# Patient Record
Sex: Male | Born: 1950 | Race: Black or African American | Hispanic: No | Marital: Single | Smoking: Never smoker
Health system: Southern US, Community
[De-identification: ages and names within clinical notes are randomized; demographics above are authoritative.]

---

## 2012-11-16 ENCOUNTER — Emergency Department (HOSPITAL_COMMUNITY): Payer: Self-pay

## 2012-11-16 ENCOUNTER — Encounter (HOSPITAL_COMMUNITY): Payer: Self-pay

## 2012-11-16 ENCOUNTER — Emergency Department (HOSPITAL_COMMUNITY)
Admission: EM | Admit: 2012-11-16 | Discharge: 2012-11-16 | Disposition: A | Discharge status: Home / Self Care | Payer: Self-pay | Attending: Emergency Medicine | Admitting: Emergency Medicine

## 2012-11-16 DIAGNOSIS — R059 Cough, unspecified: Secondary | ICD-10-CM | POA: Insufficient documentation

## 2012-11-16 DIAGNOSIS — R05 Cough: Secondary | ICD-10-CM | POA: Insufficient documentation

## 2012-11-16 DIAGNOSIS — R509 Fever, unspecified: Secondary | ICD-10-CM | POA: Insufficient documentation

## 2012-11-16 DIAGNOSIS — R51 Headache: Secondary | ICD-10-CM | POA: Insufficient documentation

## 2012-11-16 LAB — COMPREHENSIVE METABOLIC PANEL
Albumin: 3.6 g/dL (ref 3.5–5.2)
Alkaline Phosphatase: 93 U/L (ref 39–117)
BUN: 12 mg/dL (ref 6–23)
Chloride: 94 mEq/L — ABNORMAL LOW (ref 96–112)
Creatinine, Ser: 1.24 mg/dL (ref 0.50–1.35)
GFR calc Af Amer: 72 mL/min — ABNORMAL LOW (ref >90)
GFR calc non Af Amer: 62 mL/min — ABNORMAL LOW (ref >90)
Glucose, Bld: 110 mg/dL — ABNORMAL HIGH (ref 70–99)
Total Bilirubin: 0.5 mg/dL (ref 0.3–1.2)

## 2012-11-16 LAB — URINALYSIS, ROUTINE W REFLEX MICROSCOPIC
Glucose, UA: NEGATIVE mg/dL
Ketones, ur: 15 mg/dL — AB
Leukocytes, UA: NEGATIVE
Nitrite: NEGATIVE
Protein, ur: NEGATIVE mg/dL
Urobilinogen, UA: 1 mg/dL (ref 0.0–1.0)

## 2012-11-16 LAB — CBC WITH DIFFERENTIAL/PLATELET
Basophils Relative: 1 % (ref 0–1)
HCT: 40.2 % (ref 39.0–52.0)
Hemoglobin: 13.6 g/dL (ref 13.0–17.0)
Lymphs Abs: 1.4 10*3/uL (ref 0.7–4.0)
MCH: 27 pg (ref 26.0–34.0)
MCHC: 33.8 g/dL (ref 30.0–36.0)
Monocytes Absolute: 0.6 10*3/uL (ref 0.1–1.0)
Monocytes Relative: 10 % (ref 3–12)
Neutro Abs: 3.6 10*3/uL (ref 1.7–7.7)
RBC: 5.04 MIL/uL (ref 4.22–5.81)

## 2012-11-16 LAB — RAPID HIV SCREEN (WH-MAU): Rapid HIV Screen: NONREACTIVE

## 2012-11-16 MED ORDER — BENZONATATE 200 MG PO CAPS: 200.0000 mg | ORAL_CAPSULE | Freq: Three times a day (TID) | ORAL | Status: AC | PRN

## 2012-11-16 MED ORDER — LEVOFLOXACIN 500 MG PO TABS: 500.0000 mg | ORAL_TABLET | Freq: Every day | ORAL | Status: AC

## 2012-11-16 MED ORDER — ACETAMINOPHEN 325 MG PO TABS
650.0000 mg | ORAL_TABLET | Freq: Once | ORAL | Status: AC
  Administered 2012-11-16: 650 mg via ORAL
  Filled 2012-11-16: qty 2

## 2012-11-16 NOTE — ED Notes (Signed)
Pt is reporting generalized body aches and pain x 1 week. States he feels it in his joints and hands. Ambulates with no noted difficulty. Denies N/V/D at this time, but states on Thursday he had one episode of hemoptysis. No abdominal pain. AO x 4. Family at bedside.

## 2012-11-16 NOTE — ED Provider Notes (Signed)
History  This chart was scribed for non-physician practitioner working with Gwyneth Sprout, MD by Greggory Stallion, ED scribe. This patient was seen in room TR06C/TR06C and the patient's care was started at 4:22 PM.  CSN: [id number]  Arrival date & time 11/16/12  1536    Chief Complaint  Patient presents with  . Fever    The history is provided by the patient. No language interpreter was used.    HPI Comments: Miguel Harris is a 62 y.o. male who presents to the Emergency Department complaining of fever. Pt states he ate half of a tuna sub Tuesday and has had a fever every day since then. He states he had one episode of emesis and one episode of diarrhea on Wednesday. Pt states he does not have an appetite. He states when he tries to breath in deeply he has to cough. He states he has a headache that starts at the base of his neck and radiates up into his head. Pt denies ear pain, visual disturbance, CP, abdominal pain, nausea, urinary symptoms, back pain, weakness, numbness and rash as associated symptoms. Pt denies smoking or alcohol use.   History reviewed. No pertinent past medical history.  History reviewed. No pertinent past surgical history.  History reviewed. No pertinent family history.  History  Substance Use Topics  . Smoking status: Never Smoker   . Smokeless tobacco: Never Used  . Alcohol Use: Not on file      Review of Systems  A complete 10 system review of systems was obtained and all systems are negative except as noted in the HPI and PMH.   Allergies  Review of patient's allergies indicates no known allergies.  Home Medications  No current outpatient prescriptions on file.  BP 127/74  Pulse 92  Temp(Src) 103.1 F (39.5 C) (Oral)  Resp 18  SpO2 98%  Physical Exam  Nursing note and vitals reviewed. Constitutional: He is oriented to person, place, and time. He appears well-developed and well-nourished.  HENT:  Head: Normocephalic and atraumatic.   Right Ear: External ear normal.  Left Ear: External ear normal.  Mouth/Throat: Oropharynx is clear and moist.  Eyes: Pupils are equal, round, and reactive to light.  Neck: Muscular tenderness (Pain with flexing) present. No Brudzinski's sign and no Kernig's sign noted.  No supraclavicular nodes  Cardiovascular: Normal rate and regular rhythm.   Pulmonary/Chest: Effort normal and breath sounds normal.  Abdominal: Soft. There is no tenderness.  Musculoskeletal: Normal range of motion.  Lymphadenopathy:    He has no cervical adenopathy.  Neurological: He is alert and oriented to person, place, and time.  Skin: Skin is warm and dry.    ED Course  Procedures (including critical care time)  DIAGNOSTIC STUDIES: Oxygen Saturation is 98% on RA, normal by my interpretation.    COORDINATION OF CARE: 4:32 PM-Discussed treatment plan with pt at bedside and pt agreed to plan.   Labs Reviewed  CBC WITH DIFFERENTIAL  COMPREHENSIVE METABOLIC PANEL  URINALYSIS, ROUTINE W REFLEX MICROSCOPIC   No results found.   No diagnosis found.    MDM   Patient with fever, neck pain, dry cough for the last 6 days. One episode of vomiting and diarrhea 5 days ago but none since. Decreased by mouth intake but denies any chest or abdominal pain. Patient is well appearing on exam but does have a temperature of 103.1. He denies any recent travel, incarceration or sick contacts. No history of HIV but has not been tested.  Patient does have pain with neck flexion on exam without true meningeal signs.  Patient also states he may have had tick exposure but has not found any ticks on him.  CBC, CMP, UA, chest x-ray, HIV pending. Patient was moved to the back.    I personally performed the services described in this documentation, which was scribed in my presence.  The recorded information has been reviewed and considered.   Gwyneth Sprout, MD 11/16/12 [phone]

## 2012-11-16 NOTE — ED Notes (Signed)
Pt c/o dry cough, posterior headache starting at base of his neck radiating up into his head, stiff joints, all over body soreness, decrease sleep, and decrease appetite. Pt reports having 1 episode of V/D last Tuesday. Pt reports his symptoms started after he [place] [employer]. Pt denies ear pain, sore throat, or sensitivity to light.

## 2012-11-16 NOTE — ED Notes (Signed)
PT returned from CT

## 2012-11-16 NOTE — ED Provider Notes (Signed)
Change of shift, care signed out from Dr. Anitra Lauth. The patient presents with a fever for at least 5 days, a dry nonproductive cough and a history of a diarrheal illness last week. He has an ongoing mild to moderate headache which she describes as pain with palpation of the scalp, a slight itch in his throat and a persistent dry cough. On my exam the patient is febrile, this has improved with antipyretics, he also has a soft abdomen, clear heart and lung sounds and clear oropharynx without any erythema exudate asymmetry or hypertrophy. He has normal bowel sounds, no rashes.  His labs show no significant findings including no urinary tract infection, no pneumonia.  I discussed with the patient at length the need for lumbar puncture to further evaluate for source of headache, mild neck pain and fevers. He has expressed his understanding, is able to explain to me the risks benefits and states that he refuses to have the lumbar puncture at this time instead preferring to go home and try his "homeopathic treatments". He will return to the hospital should his symptoms worsen. I will prescribe him Levaquin and a Tessalon pill for the cough.  Vida Roller, MD 11/16/12 2040

## 2012-11-16 NOTE — ED Notes (Signed)
Pt has a urine cup and knows we need a urine sample 

## 2014-10-21 IMAGING — CR DG CHEST 2V
2 series · 2 of 2 positions shown · non-contrast
Comparison: None.

CLINICAL DATA: Cough, fever

CHEST - 2 VIEW

[w chest pa]
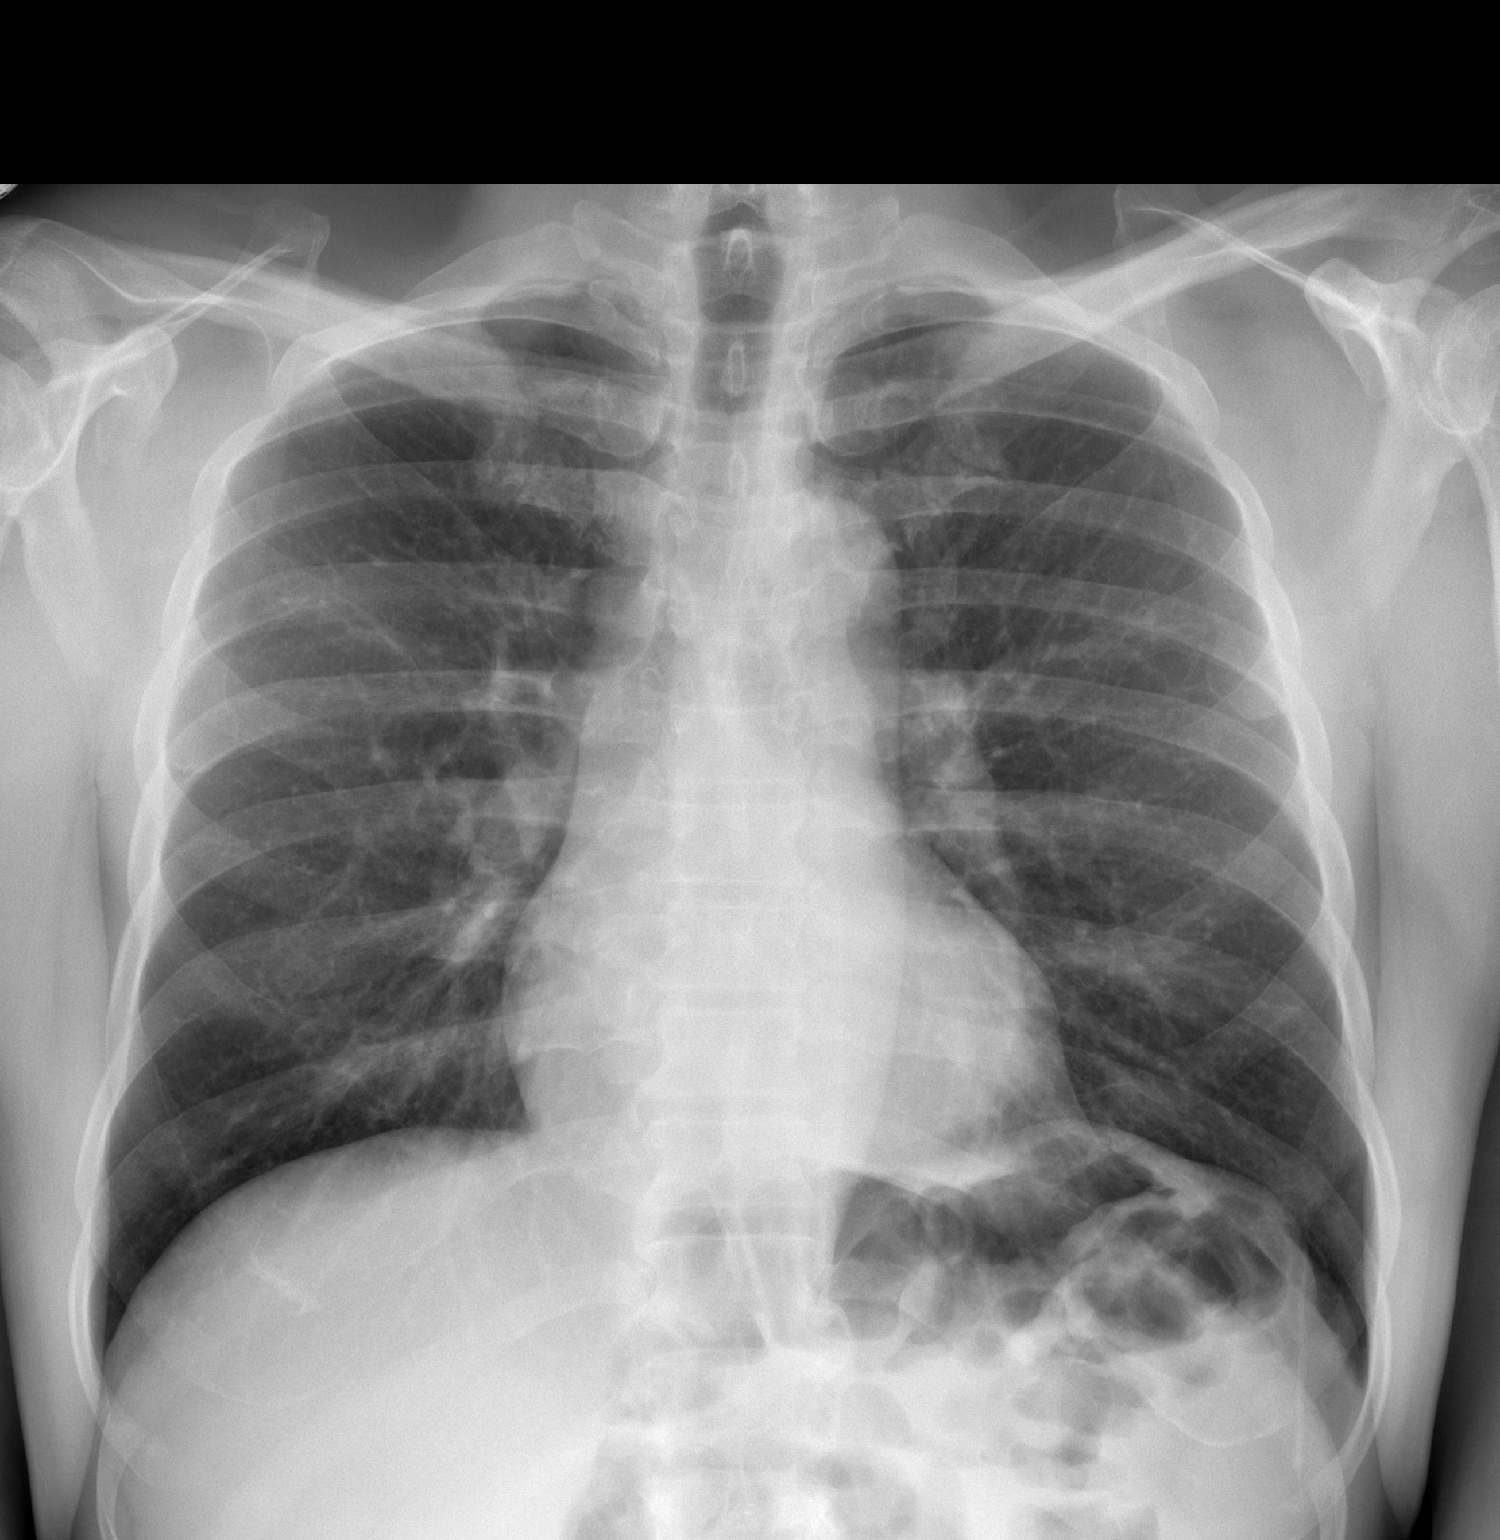

[w chest lat]
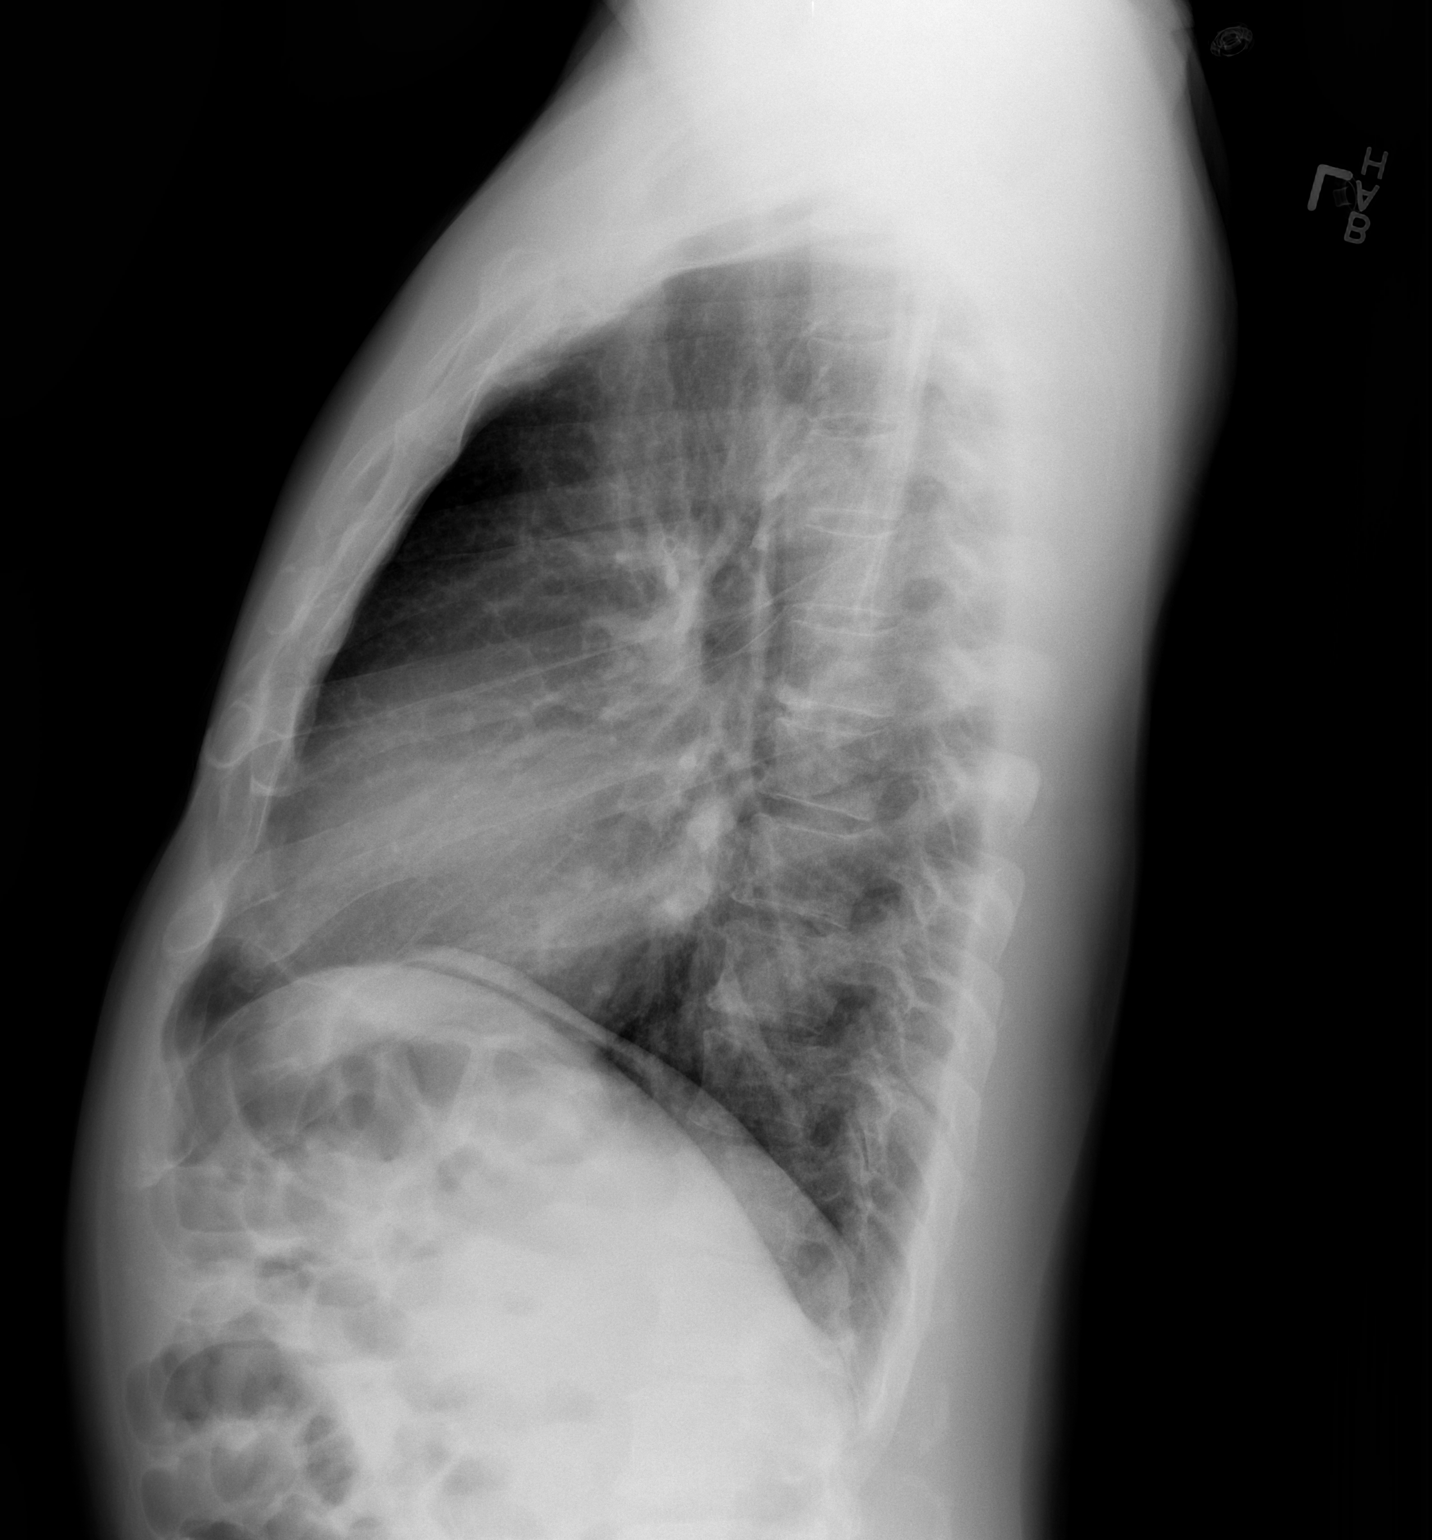

[2 of 2 positions shown; findings below may reference images not displayed]

FINDINGS: Lungs are essentially clear.  No focal consolidation. No
pleural effusion or pneumothorax.

Heart is normal in size.

Mild degenerative changes of the visualized thoracolumbar spine.
IMPRESSION: No evidence of acute cardiopulmonary disease.

## 2020-02-20 ENCOUNTER — Emergency Department (HOSPITAL_BASED_OUTPATIENT_CLINIC_OR_DEPARTMENT_OTHER)
Admission: EM | Admit: 2020-02-20 | Discharge: 2020-02-21 | Disposition: A | Discharge status: Home / Self Care | Payer: Medicare Other | Attending: Emergency Medicine | Admitting: Emergency Medicine

## 2020-02-20 ENCOUNTER — Encounter (HOSPITAL_BASED_OUTPATIENT_CLINIC_OR_DEPARTMENT_OTHER): Payer: Self-pay | Admitting: Emergency Medicine

## 2020-02-20 ENCOUNTER — Other Ambulatory Visit: Payer: Self-pay

## 2020-02-20 ENCOUNTER — Emergency Department (HOSPITAL_BASED_OUTPATIENT_CLINIC_OR_DEPARTMENT_OTHER): Payer: Medicare Other

## 2020-02-20 DIAGNOSIS — U071 COVID-19: Secondary | ICD-10-CM | POA: Insufficient documentation

## 2020-02-20 DIAGNOSIS — Z5321 Procedure and treatment not carried out due to patient leaving prior to being seen by health care provider: Secondary | ICD-10-CM | POA: Diagnosis not present

## 2020-02-20 DIAGNOSIS — R05 Cough: Secondary | ICD-10-CM | POA: Diagnosis present

## 2020-02-20 LAB — SARS CORONAVIRUS 2 BY RT PCR (HOSPITAL ORDER, PERFORMED IN ~~LOC~~ HOSPITAL LAB): SARS Coronavirus 2: POSITIVE — AB

## 2020-02-20 NOTE — ED Notes (Signed)
Dr. Particia Nearing aware of the positive covid result.

## 2020-02-20 NOTE — ED Triage Notes (Signed)
Reports cough for the last two weeks and temp goes up and down.  Has not been tested for covid.

## 2020-02-21 ENCOUNTER — Telehealth (HOSPITAL_COMMUNITY): Payer: Self-pay | Admitting: Nurse Practitioner

## 2020-02-21 DIAGNOSIS — U071 COVID-19: Secondary | ICD-10-CM

## 2020-02-21 NOTE — ED Notes (Signed)
Called to room several times with no answer received.

## 2020-02-21 NOTE — Telephone Encounter (Signed)
Called to Discuss with patient about Covid symptoms and the use of regeneron, a monoclonal antibody infusion for those with mild to moderate Covid symptoms and at a high risk of hospitalization.     Pt is qualified for this infusion at the [facility] due to co-morbid conditions and/or a member of an at-risk group.     Unable to reach pt. Cell number voicemail not consistent with patient (name and gender don't match). Called house phone. Number not connected.   Consuello Masse, DNP, AGNP-C [phone] (Infusion Center Hotline)
